# Patient Record
Sex: Male | Born: 1985
Health system: Southern US, Community
[De-identification: ages and names within clinical notes are randomized; demographics above are authoritative.]

## PROBLEM LIST (undated history)

## (undated) DIAGNOSIS — E119 Type 2 diabetes mellitus without complications: Secondary | ICD-10-CM

## (undated) HISTORY — DX: Type 2 diabetes mellitus without complications: E11.9

---

## 2014-01-18 ENCOUNTER — Emergency Department (HOSPITAL_BASED_OUTPATIENT_CLINIC_OR_DEPARTMENT_OTHER)
Admission: EM | Admit: 2014-01-18 | Discharge: 2014-01-18 | Disposition: A | Discharge status: Home / Self Care | Payer: No Typology Code available for payment source | Attending: Emergency Medicine | Admitting: Emergency Medicine

## 2014-01-18 ENCOUNTER — Encounter (HOSPITAL_BASED_OUTPATIENT_CLINIC_OR_DEPARTMENT_OTHER): Payer: Self-pay | Admitting: Emergency Medicine

## 2014-01-18 ENCOUNTER — Emergency Department (HOSPITAL_BASED_OUTPATIENT_CLINIC_OR_DEPARTMENT_OTHER): Payer: No Typology Code available for payment source

## 2014-01-18 DIAGNOSIS — F1210 Cannabis abuse, uncomplicated: Secondary | ICD-10-CM | POA: Insufficient documentation

## 2014-01-18 DIAGNOSIS — H53429 Scotoma of blind spot area, unspecified eye: Secondary | ICD-10-CM | POA: Insufficient documentation

## 2014-01-18 DIAGNOSIS — M542 Cervicalgia: Secondary | ICD-10-CM | POA: Insufficient documentation

## 2014-01-18 DIAGNOSIS — R519 Headache, unspecified: Secondary | ICD-10-CM

## 2014-01-18 DIAGNOSIS — R51 Headache: Secondary | ICD-10-CM | POA: Insufficient documentation

## 2014-01-18 DIAGNOSIS — H9209 Otalgia, unspecified ear: Secondary | ICD-10-CM | POA: Insufficient documentation

## 2014-01-18 DIAGNOSIS — Z87828 Personal history of other (healed) physical injury and trauma: Secondary | ICD-10-CM | POA: Insufficient documentation

## 2014-01-18 DIAGNOSIS — K089 Disorder of teeth and supporting structures, unspecified: Secondary | ICD-10-CM | POA: Insufficient documentation

## 2014-01-18 MED ORDER — OXYCODONE-ACETAMINOPHEN 5-325 MG PO TABS
2.0000 | ORAL_TABLET | Freq: Once | ORAL | Status: AC
Start: 2014-01-18 — End: 2014-01-18
  Administered 2014-01-18: 2 via ORAL
  Filled 2014-01-18: qty 2

## 2014-01-18 NOTE — ED Notes (Addendum)
Patient c/o HA/jaw pain/L side neck pain for the past three days. Has had this issue off and on over the past year. Tool aleve last night & they helped ease the pain. Patient states that he has experienced wax build up in his left ear at times
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
## 2014-01-18 NOTE — Discharge Instructions (Signed)
Migraine Headache A migraine headache is an intense, throbbing pain on one or both sides of your head. A migraine can last for 30 minutes to several hours. CAUSES  The exact cause of a migraine headache is not always known. However, a migraine may be caused when nerves in the brain become irritated and release chemicals that cause inflammation. This causes pain. Certain things may also trigger migraines, such as:  Alcohol.  Smoking.  Stress.  Menstruation.  Aged cheeses.  Foods or drinks that contain nitrates, glutamate, aspartame, or tyramine.  Lack of sleep.  Chocolate.  Caffeine.  Hunger.  Physical exertion.  Fatigue.  Medicines used to treat chest pain (nitroglycerine), birth control pills, estrogen, and some blood pressure medicines. SIGNS AND SYMPTOMS  Pain on one or both sides of your head.  Pulsating or throbbing pain.  Severe pain that prevents daily activities.  Pain that is aggravated by any physical activity.  Nausea, vomiting, or both.  Dizziness.  Pain with exposure to bright lights, loud noises, or activity.  General sensitivity to bright lights, loud noises, or smells. Before you get a migraine, you may get warning signs that a migraine is coming (aura). An aura may include:  Seeing flashing lights.  Seeing bright spots, halos, or zig-zag lines.  Having tunnel vision or blurred vision.  Having feelings of numbness or tingling.  Having trouble talking.  Having muscle weakness. DIAGNOSIS  A migraine headache is often diagnosed based on:  Symptoms.  Physical exam.  A CT scan or MRI of your head. These imaging tests cannot diagnose migraines, but they can help rule out other causes of headaches. TREATMENT Medicines may be given for pain and nausea. Medicines can also be given to help prevent recurrent migraines.  HOME CARE INSTRUCTIONS  Only take over-the-counter or prescription medicines for pain or discomfort as directed by your  health care provider. The use of long-term narcotics is not recommended.  Lie down in a dark, quiet room when you have a migraine.  Keep a journal to find out what may trigger your migraine headaches. For example, write down:  What you eat and drink.  How much sleep you get.  Any change to your diet or medicines.  Limit alcohol consumption.  Quit smoking if you smoke.  Get 7 9 hours of sleep, or as recommended by your health care provider.  Limit stress.  Keep lights dim if bright lights bother you and make your migraines worse. SEEK IMMEDIATE MEDICAL CARE IF:   Your migraine becomes severe.  You have a fever.  You have a stiff neck.  You have vision loss.  You have muscular weakness or loss of muscle control.  You start losing your balance or have trouble walking.  You feel faint or pass out.  You have severe symptoms that are different from your first symptoms. MAKE SURE YOU:   Understand these instructions.  Will watch your condition.  Will get help right away if you are not doing well or get worse. Document Released: 12/09/2005 Document Revised: 09/29/2013 Document Reviewed: 08/16/2013 [employer] Patient Information 2014 [employer].  

## 2017-06-10 ENCOUNTER — Inpatient Hospital Stay (HOSPITAL_BASED_OUTPATIENT_CLINIC_OR_DEPARTMENT_OTHER)
Admission: EM | Admit: 2017-06-10 | Discharge: 2017-06-12 | DRG: 638 | Disposition: A | Discharge status: Home / Self Care | Payer: Medicaid Other | Attending: Internal Medicine | Admitting: Internal Medicine

## 2017-06-10 ENCOUNTER — Encounter (HOSPITAL_BASED_OUTPATIENT_CLINIC_OR_DEPARTMENT_OTHER): Payer: Self-pay | Admitting: *Deleted

## 2017-06-10 ENCOUNTER — Inpatient Hospital Stay (HOSPITAL_COMMUNITY): Payer: Medicaid Other

## 2017-06-10 DIAGNOSIS — E1110 Type 2 diabetes mellitus with ketoacidosis without coma: Secondary | ICD-10-CM | POA: Diagnosis present

## 2017-06-10 DIAGNOSIS — E0810 Diabetes mellitus due to underlying condition with ketoacidosis without coma: Secondary | ICD-10-CM

## 2017-06-10 DIAGNOSIS — E876 Hypokalemia: Secondary | ICD-10-CM | POA: Diagnosis present

## 2017-06-10 DIAGNOSIS — N179 Acute kidney failure, unspecified: Secondary | ICD-10-CM | POA: Diagnosis present

## 2017-06-10 DIAGNOSIS — E860 Dehydration: Secondary | ICD-10-CM | POA: Diagnosis present

## 2017-06-10 DIAGNOSIS — E1010 Type 1 diabetes mellitus with ketoacidosis without coma: Secondary | ICD-10-CM | POA: Diagnosis not present

## 2017-06-10 DIAGNOSIS — E871 Hypo-osmolality and hyponatremia: Secondary | ICD-10-CM | POA: Diagnosis present

## 2017-06-10 LAB — I-STAT VENOUS BLOOD GAS, ED
ACID-BASE DEFICIT: 17 mmol/L — AB (ref 0.0–2.0)
Bicarbonate: 10.9 mmol/L — ABNORMAL LOW (ref 20.0–28.0)
O2 SAT: 35 %
PH VEN: 7.132 — AB (ref 7.250–7.430)
TCO2: 12 mmol/L (ref 0–100)
pCO2, Ven: 32.6 mmHg — ABNORMAL LOW (ref 44.0–60.0)
pO2, Ven: 27 mmHg — CL (ref 32.0–45.0)

## 2017-06-10 LAB — GLUCOSE, CAPILLARY
Glucose-Capillary: 179 mg/dL — ABNORMAL HIGH (ref 65–99)
Glucose-Capillary: 173 mg/dL — ABNORMAL HIGH (ref 65–99)
Glucose-Capillary: 165 mg/dL — ABNORMAL HIGH (ref 65–99)
Glucose-Capillary: 143 mg/dL — ABNORMAL HIGH (ref 65–99)

## 2017-06-10 LAB — CBC WITH DIFFERENTIAL/PLATELET
BASOS ABS: 0.1 10*3/uL (ref 0.0–0.1)
BASOS PCT: 1 %
EOS ABS: 0.1 10*3/uL (ref 0.0–0.7)
Eosinophils Relative: 1 %
HCT: 49 % (ref 39.0–52.0)
HEMOGLOBIN: 17.4 g/dL — AB (ref 13.0–17.0)
Lymphocytes Relative: 39 %
Lymphs Abs: 2.8 10*3/uL (ref 0.7–4.0)
MCH: 30.8 pg (ref 26.0–34.0)
MCHC: 35.5 g/dL (ref 30.0–36.0)
MCV: 86.7 fL (ref 78.0–100.0)
MONOS PCT: 8 %
Monocytes Absolute: 0.6 10*3/uL (ref 0.1–1.0)
NEUTROS ABS: 3.7 10*3/uL (ref 1.7–7.7)
Neutrophils Relative %: 51 %
Platelets: 238 10*3/uL (ref 150–400)
RBC: 5.65 MIL/uL (ref 4.22–5.81)
RDW: 13.4 % (ref 11.5–15.5)
WBC: 7.1 10*3/uL (ref 4.0–10.5)

## 2017-06-10 LAB — CBC
HCT: 40 % (ref 39.0–52.0)
Hemoglobin: 14.1 g/dL (ref 13.0–17.0)
MCH: 30.3 pg (ref 26.0–34.0)
MCHC: 35.3 g/dL (ref 30.0–36.0)
MCV: 86 fL (ref 78.0–100.0)
Platelets: 219 10*3/uL (ref 150–400)
RBC: 4.65 MIL/uL (ref 4.22–5.81)
RDW: 13.2 % (ref 11.5–15.5)
WBC: 7.2 10*3/uL (ref 4.0–10.5)

## 2017-06-10 LAB — BASIC METABOLIC PANEL
ANION GAP: 19 — AB (ref 5–15)
Anion gap: 8 (ref 5–15)
BUN: 13 mg/dL (ref 6–20)
BUN: 9 mg/dL (ref 6–20)
CO2: 12 mmol/L — ABNORMAL LOW (ref 22–32)
CO2: 18 mmol/L — ABNORMAL LOW (ref 22–32)
CREATININE: 1.25 mg/dL — AB (ref 0.61–1.24)
CREATININE: 0.99 mg/dL (ref 0.61–1.24)
Calcium: 9.9 mg/dL (ref 8.9–10.3)
Calcium: 8.7 mg/dL — ABNORMAL LOW (ref 8.9–10.3)
Chloride: 101 mmol/L (ref 101–111)
Chloride: 107 mmol/L (ref 101–111)
GFR calc Af Amer: >60 mL/min (ref >60)
GFR calc non Af Amer: >60 mL/min (ref >60)
GLUCOSE: 157 mg/dL — AB (ref 65–99)
Glucose, Bld: 347 mg/dL — ABNORMAL HIGH (ref 65–99)
Potassium: 4.1 mmol/L (ref 3.5–5.1)
Potassium: 3 mmol/L — ABNORMAL LOW (ref 3.5–5.1)
SODIUM: 132 mmol/L — AB (ref 135–145)
Sodium: 133 mmol/L — ABNORMAL LOW (ref 135–145)

## 2017-06-10 LAB — URINALYSIS, MICROSCOPIC (REFLEX)

## 2017-06-10 LAB — URINALYSIS, ROUTINE W REFLEX MICROSCOPIC
BILIRUBIN URINE: NEGATIVE
Glucose, UA: >=500 mg/dL — AB
Ketones, ur: >80 mg/dL — AB
LEUKOCYTES UA: NEGATIVE
NITRITE: NEGATIVE
Protein, ur: 30 mg/dL — AB
Specific Gravity, Urine: 1.025 (ref 1.005–1.030)
pH: 5 (ref 5.0–8.0)

## 2017-06-10 LAB — MAGNESIUM: MAGNESIUM: 1.9 mg/dL (ref 1.7–2.4)

## 2017-06-10 LAB — CBG MONITORING, ED
GLUCOSE-CAPILLARY: 245 mg/dL — AB (ref 65–99)
GLUCOSE-CAPILLARY: 232 mg/dL — AB (ref 65–99)
Glucose-Capillary: 273 mg/dL — ABNORMAL HIGH (ref 65–99)

## 2017-06-10 LAB — PHOSPHORUS: Phosphorus: 1 mg/dL — CL (ref 2.5–4.6)

## 2017-06-10 LAB — MRSA PCR SCREENING: MRSA by PCR: NEGATIVE

## 2017-06-10 MED ORDER — SODIUM CHLORIDE 0.9 % IV SOLN: INTRAVENOUS | Status: DC

## 2017-06-10 MED ORDER — HEPARIN SODIUM (PORCINE) 5000 UNIT/ML IJ SOLN
5000.0000 [IU] | Freq: Three times a day (TID) | INTRAMUSCULAR | Status: DC
  Administered 2017-06-10 – 2017-06-12 (×6): 5000 [IU] via SUBCUTANEOUS
  Filled 2017-06-10 (×5): qty 1

## 2017-06-10 MED ORDER — BISMUTH SUBSALICYLATE 262 MG/15ML PO SUSP
30.0000 mL | Freq: Four times a day (QID) | ORAL | Status: DC | PRN
  Filled 2017-06-10: qty 236

## 2017-06-10 MED ORDER — SODIUM CHLORIDE 0.9 % IV SOLN
INTRAVENOUS | Status: AC
  Administered 2017-06-10: 22:00:00 via INTRAVENOUS

## 2017-06-10 MED ORDER — DEXTROSE-NACL 5-0.45 % IV SOLN
INTRAVENOUS | Status: DC
  Administered 2017-06-10: 16:00:00 via INTRAVENOUS

## 2017-06-10 MED ORDER — POTASSIUM CHLORIDE 10 MEQ/100ML IV SOLN
10.0000 meq | INTRAVENOUS | Status: AC
  Administered 2017-06-10 (×2): 10 meq via INTRAVENOUS
  Filled 2017-06-10 (×2): qty 100

## 2017-06-10 MED ORDER — SODIUM CHLORIDE 0.9 % IV SOLN
INTRAVENOUS | Status: DC
  Administered 2017-06-10: 22:00:00 via INTRAVENOUS
  Filled 2017-06-10 (×2): qty 1

## 2017-06-10 MED ORDER — SODIUM CHLORIDE 0.9 % IV SOLN
INTRAVENOUS | Status: DC
  Administered 2017-06-10: 2.1 [IU]/h via INTRAVENOUS
  Filled 2017-06-10 (×2): qty 1

## 2017-06-10 MED ORDER — POTASSIUM PHOSPHATES 15 MMOLE/5ML IV SOLN
40.0000 meq | Freq: Once | INTRAVENOUS | Status: AC
  Administered 2017-06-10: 40 meq via INTRAVENOUS
  Filled 2017-06-10: qty 9.09

## 2017-06-10 MED ORDER — DEXTROSE-NACL 5-0.45 % IV SOLN
INTRAVENOUS | Status: DC
  Administered 2017-06-10 – 2017-06-11 (×2): via INTRAVENOUS

## 2017-06-10 MED ORDER — LACTATED RINGERS IV BOLUS (SEPSIS)
2000.0000 mL | Freq: Once | INTRAVENOUS | Status: AC
  Administered 2017-06-10: 2000 mL via INTRAVENOUS

## 2017-06-10 NOTE — H&P (Signed)
History and Physical    Adrian Vasquez ZOX:[id number] DOB: 10-Oct-1986 DOA: 06/10/2017  Referring MD/NP/PA:  PCP: Patient, No Pcp Per  Outpatient Specialists:  Patient coming from: Home  Chief Complaint: Fatigue  HPI: Adrian Vasquez is a 31 y.o. male with medical history no history of diabetes mellitus presented with three-day history of fatigue is assisted with polydipsia and polyuria without any fever or chills. He had had some nausea with an episode of vomiting, no abdominal pain.  She he was seen at the doctor's office in [facility] whereupon he was noted to have hyperglycemia of more than 300 mg/dL with ketonuria and referred to the emergency room at [facility].  Nasal chest pain, shortness of breath, diarrhea, palpitations.  ED Course: At the ED patient was hemodynamically stable without any fever. Labs revealed hyperglycemia of 347 mg/dL with elevated anion gap/acidosis, creatinine of 1.25 and potassium 4.1. Urinalysis was negative for any occult infection and patient was then IV fluids with insulin.  Review of Systems: As per HPI otherwise 10 point review of systems negative.    History reviewed. No pertinent past medical history.  History reviewed. No pertinent surgical history.   reports that he has never smoked. He has never used smokeless tobacco. He reports that he does not drink alcohol or use drugs.  No Known Allergies  History reviewed. No pertinent family history.   Prior to Admission medications   Medication Sig Start Date End Date Taking? Authorizing Provider  bismuth subsalicylate (PEPTO BISMOL) 262 MG/15ML suspension Take 30 mLs by mouth every 6 (six) hours as needed for indigestion.   Yes [provider]  calcium carbonate (TUMS - DOSED IN MG ELEMENTAL CALCIUM) 500 MG chewable tablet Chew 1 tablet by mouth 2 (two) times daily as needed for indigestion or heartburn.   Yes [provider]    Physical Exam: Vitals:   06/10/17 1730 06/10/17 1800 06/10/17 1830 06/10/17 2000  BP: (!) 138/91 135/89  134/83  Pulse: 73 80 79 84  Resp: 16 13 17 12   Temp:      TempSrc:      SpO2: 100% 100% 100% 99%  Weight:      Height:          Constitutional: NAD, calm, comfortable Vitals:   06/10/17 1730 06/10/17 1800 06/10/17 1830 06/10/17 2000  BP: (!) 138/91 135/89  134/83  Pulse: 73 80 79 84  Resp: 16 13 17 12   Temp:      TempSrc:      SpO2: 100% 100% 100% 99%  Weight:      Height:       Eyes: PERRL, lids and conjunctivae normal ENMT: Mucous membranes are moist. Posterior pharynx clear of any exudate or lesions.Normal dentition.  Neck: normal, supple, no masses, no thyromegaly Respiratory: clear to auscultation bilaterally, no wheezing, no crackles. Normal respiratory effort. No accessory muscle use.  Cardiovascular: Regular rate and rhythm, no murmurs / rubs / gallops. No extremity edema. 2+ pedal pulses. No carotid bruits.  Abdomen: no tenderness, no masses palpated. No hepatosplenomegaly. Bowel sounds positive.  Musculoskeletal: no clubbing / cyanosis. No joint deformity upper and lower extremities. Good ROM, no contractures. Normal muscle tone.  Skin: no rashes, lesions, ulcers. No induration Neurologic: CN 2-12 grossly intact. Sensation intact, DTR normal. Strength 5/5 in all 4.  Psychiatric: Normal judgment and insight. Alert and oriented x 3. Normal mood.     Labs on Admission: I have personally reviewed following labs and  imaging studies  CBC:  Recent Labs Lab 06/10/17 1412  WBC 7.1  NEUTROABS 3.7  HGB 17.4*  HCT 49.0  MCV 86.7  PLT 238   Basic Metabolic Panel:  Recent Labs Lab 06/10/17 1412  NA 132*  K 4.1  CL 101  CO2 12*  GLUCOSE 347*  BUN 13  CREATININE 1.25*  CALCIUM 9.9   GFR: Estimated Creatinine Clearance: 85.4 mL/min (A) (by C-G formula based on SCr of 1.25 mg/dL (H)). Liver Function Tests: No results for input(s): AST, ALT, ALKPHOS, BILITOT, PROT, ALBUMIN in  the last 168 hours. No results for input(s): LIPASE, AMYLASE in the last 168 hours. No results for input(s): AMMONIA in the last 168 hours. Coagulation Profile: No results for input(s): INR, PROTIME in the last 168 hours. Cardiac Enzymes: No results for input(s): CKTOTAL, CKMB, CKMBINDEX, TROPONINI in the last 168 hours. BNP (last 3 results) No results for input(s): PROBNP in the last 8760 hours. HbA1C: No results for input(s): HGBA1C in the last 72 hours. CBG:  Recent Labs Lab 06/10/17 1523 06/10/17 1645 06/10/17 1749 06/10/17 1948 06/10/17 2051  GLUCAP 273* 245* 232* 179* 173*   Lipid Profile: No results for input(s): CHOL, HDL, LDLCALC, TRIG, CHOLHDL, LDLDIRECT in the last 72 hours. Thyroid Function Tests: No results for input(s): TSH, T4TOTAL, FREET4, T3FREE, THYROIDAB in the last 72 hours. Anemia Panel: No results for input(s): VITAMINB12, FOLATE, FERRITIN, TIBC, IRON, RETICCTPCT in the last 72 hours. Urine analysis:    Component Value Date/Time   COLORURINE YELLOW 06/10/2017 1602   APPEARANCEUR CLEAR 06/10/2017 1602   LABSPEC 1.025 06/10/2017 1602   PHURINE 5.0 06/10/2017 1602   GLUCOSEU >=500 (A) 06/10/2017 1602   HGBUR TRACE (A) 06/10/2017 1602   BILIRUBINUR NEGATIVE 06/10/2017 1602   KETONESUR >80 (A) 06/10/2017 1602   PROTEINUR 30 (A) 06/10/2017 1602   NITRITE NEGATIVE 06/10/2017 1602   LEUKOCYTESUR NEGATIVE 06/10/2017 1602   Sepsis Labs: @LABRCNTIP (procalcitonin:4,lacticidven:4) )No results found for this or any previous visit (from the past 240 hour(s)).   Radiological Exams on Admission: No results found.  EKG: n/a  Assessment/Plan Principal Problem:   DKA (diabetic ketoacidoses) (HCC)    #1 DKA: No prior history of diabetes mellitus No obvious focus of infection IVF- adjust as needed IV insulin -transition when and on Closes Monitor renal function and electrolytes/potassium  #2 Renal Insufficiency: Related to diagnosis #1 Should improve  with IV fluids   #3 Newly Diagnosed DM: Check A1c DM education/medication when DKA resolved   DVT prophylaxis:Heparin Code Status: (Full) Family Communication: Mother at bedside  Disposition Plan: Home Consults called:  Admission status:(inpatient / SDU)   OSEI-BONSU,Jaylyn Iyer MD [facility] Pager [phone]  If 7PM-7AM, please contact night-coverage [email or web address] Password [email or web address]  06/10/2017, 9:10 PM

## 2017-06-10 NOTE — ED Provider Notes (Signed)
[facility] Provider Note   CSN: [id number] Arrival date & time: 06/10/17  1347     History   Chief Complaint Chief Complaint  Patient presents with  . Fatigue    HPI Adrian Vasquez is a 31 y.o. male.  31 yo F with a chief complaint of fatigue and vomiting. The patient states that he is feeling bad for the past week or so. Having polyphagia and polydipsia. A couple episodes of vomiting yesterday. Denies abdominal pain. Denies shortness of breath. No history of hyperglycemia. Was seen at urgent care and there is concern for diabetic ketoacidosis and was sent here. He denies fevers chest pain dysuria flank pain.   The history is provided by the patient.  Illness  This is a new problem. The current episode started yesterday. The problem occurs constantly. The problem has not changed since onset.Pertinent negatives include no chest pain, no abdominal pain, no headaches and no shortness of breath. Nothing aggravates the symptoms. Nothing relieves the symptoms. He has tried nothing for the symptoms. The treatment provided no relief.    Past Medical History:  Diagnosis Date  . Diabetes mellitus without complication (HCC)     There are no active problems to display for this patient.   History reviewed. No pertinent surgical history.     Home Medications    Prior to Admission medications   Not on File    Family History No family history on file.  Social History Social History  Substance Use Topics  . Smoking status: Never Smoker  . Smokeless tobacco: Never Used  . Alcohol use No     Allergies   Patient has no known allergies.   Review of Systems Review of Systems  Constitutional: Negative for chills and fever.  HENT: Negative for congestion and facial swelling.   Eyes: Negative for discharge and visual disturbance.  Respiratory: Negative for shortness of breath.   Cardiovascular: Negative for chest pain and palpitations.  Gastrointestinal:  Negative for abdominal pain, diarrhea and vomiting.  Musculoskeletal: Negative for arthralgias and myalgias.  Skin: Negative for color change and rash.  Neurological: Negative for tremors, syncope and headaches.  Psychiatric/Behavioral: Negative for confusion and dysphoric mood.     Physical Exam Updated Vital Signs BP (!) 147/96   Pulse 79   Temp 98.1 F (36.7 C) (Oral)   Resp 17   Ht 6\' 4"  (1.93 m)   Wt 71.2 kg (157 lb)   SpO2 100%   BMI 19.11 kg/m   Physical Exam  Constitutional: He is oriented to person, place, and time. He appears well-developed and well-nourished.  HENT:  Head: Normocephalic and atraumatic.  Eyes: EOM are normal. Pupils are equal, round, and reactive to light.  Neck: Normal range of motion. Neck supple. No JVD present.  Cardiovascular: Normal rate and regular rhythm.  Exam reveals no gallop and no friction rub.   No murmur heard. Pulmonary/Chest: No respiratory distress. He has no wheezes.  Abdominal: He exhibits no distension and no mass. There is no tenderness. There is no rebound and no guarding.  Musculoskeletal: Normal range of motion.  Neurological: He is alert and oriented to person, place, and time.  Skin: No rash noted. No pallor.  Psychiatric: He has a normal mood and affect. His behavior is normal.  Nursing note and vitals reviewed.    ED Treatments / Results  Labs (all labs ordered are listed, but only abnormal results are displayed) Labs Reviewed  CBC WITH DIFFERENTIAL/PLATELET - Abnormal; Notable  for the following:       Result Value   Hemoglobin 17.4 (*)    All other components within normal limits  BASIC METABOLIC PANEL - Abnormal; Notable for the following:    Sodium 132 (*)    CO2 12 (*)    Glucose, Bld 347 (*)    Creatinine, Ser 1.25 (*)    Anion gap 19 (*)    All other components within normal limits  I-STAT VENOUS BLOOD GAS, ED - Abnormal; Notable for the following:    pH, Ven 7.132 (*)    pCO2, Ven 32.6 (*)    pO2,  Ven 27.0 (*)    Bicarbonate 10.9 (*)    Acid-base deficit 17.0 (*)    All other components within normal limits  BLOOD GAS, VENOUS  URINALYSIS, ROUTINE W REFLEX MICROSCOPIC    EKG  EKG Interpretation None       Radiology No results found.  Procedures Procedures (including critical care time)  Medications Ordered in ED Medications  lactated ringers bolus 2,000 mL (2,000 mLs Intravenous New Bag/Given 06/10/17 1439)  dextrose 5 %-0.45 % sodium chloride infusion (not administered)  insulin regular (NOVOLIN R,HUMULIN R) 100 Units in sodium chloride 0.9 % 100 mL (1 Units/mL) infusion (not administered)     Initial Impression / Assessment and Plan / ED Course  I have reviewed the triage vital signs and the nursing notes.  Pertinent labs & imaging results that were available during my care of the patient were reviewed by me and considered in my medical decision making (see chart for details).     31 yo M with Diabetic ketoacidosis. He has never previously diagnosed with diabetes. Found to have an anion gap as well as a acidosis of 7.1. Will start on an insulin drip. Was given 2 L of LR on arrival.  CRITICAL CARE Performed by: Rae Roamaniel Patrick Azaryah Heathcock   Total critical care time: 35 minutes  Critical care time was exclusive of separately billable procedures and treating other patients.  Critical care was necessary to treat or prevent imminent or life-threatening deterioration.  Critical care was time spent personally by me on the following activities: development of treatment plan with patient and/or surrogate as well as nursing, discussions with consultants, evaluation of patient's response to treatment, examination of patient, obtaining history from patient or surrogate, ordering and performing treatments and interventions, ordering and review of laboratory studies, ordering and review of radiographic studies, pulse oximetry and re-evaluation of patient's condition.  The patients  results and plan were reviewed and discussed.   Any x-rays performed were independently reviewed by myself.   Differential diagnosis were considered with the presenting HPI.  Medications  lactated ringers bolus 2,000 mL (2,000 mLs Intravenous New Bag/Given 06/10/17 1439)  dextrose 5 %-0.45 % sodium chloride infusion (not administered)  insulin regular (NOVOLIN R,HUMULIN R) 100 Units in sodium chloride 0.9 % 100 mL (1 Units/mL) infusion (not administered)    Vitals:   06/10/17 1404 06/10/17 1408 06/10/17 1430 06/10/17 1500  BP:  (!) 143/96 (!) 138/95 (!) 147/96  Pulse:  90 81 79  Resp:  20  17  Temp:  98.1 F (36.7 C)    TempSrc:  Oral    SpO2:  98% 100% 100%  Weight: 71.2 kg (157 lb)     Height: 6\' 4"  (1.93 m)       Final diagnoses:  Diabetic ketoacidosis without coma associated with type 1 diabetes mellitus (HCC)    Admission/ observation were  discussed with the admitting physician, patient and/or family and they are comfortable with the plan.   Final Clinical Impressions(s) / ED Diagnoses   Final diagnoses:  Diabetic ketoacidosis without coma associated with type 1 diabetes mellitus [facility])    New Prescriptions New Prescriptions   No medications on file     Melene Plan, DO 06/10/17 1532

## 2017-06-10 NOTE — ED Triage Notes (Addendum)
Fatigue x 3 days, thirsty, no appetite, weight loss. [facility] and he had ketones in his urine, CBG 360. He was told to come here for further testing.

## 2017-06-10 NOTE — ED Notes (Signed)
Patient placed on cardiac monitor with vitals set to Q 30 min.  

## 2017-06-10 NOTE — Progress Notes (Signed)
CRITICAL VALUE ALERT  Critical Value: Phosphorus 1.0  Date & Time Notied:  06/10/2017 @ 2305  Provider Notified: Donnamarie PoagK. Kirby  Orders Received/Actions taken: 40meq potassium phosphate ordered and given

## 2017-06-11 DIAGNOSIS — E871 Hypo-osmolality and hyponatremia: Secondary | ICD-10-CM

## 2017-06-11 DIAGNOSIS — E118 Type 2 diabetes mellitus with unspecified complications: Secondary | ICD-10-CM

## 2017-06-11 LAB — BASIC METABOLIC PANEL
ANION GAP: 8 (ref 5–15)
ANION GAP: 8 (ref 5–15)
ANION GAP: 9 (ref 5–15)
Anion gap: 9 (ref 5–15)
Anion gap: 7 (ref 5–15)
BUN: <5 mg/dL — ABNORMAL LOW (ref 6–20)
BUN: 5 mg/dL — AB (ref 6–20)
BUN: 6 mg/dL (ref 6–20)
BUN: 8 mg/dL (ref 6–20)
CALCIUM: 8.8 mg/dL — AB (ref 8.9–10.3)
CHLORIDE: 107 mmol/L (ref 101–111)
CHLORIDE: 108 mmol/L (ref 101–111)
CHLORIDE: 108 mmol/L (ref 101–111)
CO2: 17 mmol/L — ABNORMAL LOW (ref 22–32)
CO2: 18 mmol/L — ABNORMAL LOW (ref 22–32)
CO2: 20 mmol/L — AB (ref 22–32)
CO2: 21 mmol/L — AB (ref 22–32)
CO2: 21 mmol/L — ABNORMAL LOW (ref 22–32)
CREATININE: 0.76 mg/dL (ref 0.61–1.24)
Calcium: 8.5 mg/dL — ABNORMAL LOW (ref 8.9–10.3)
Calcium: 8.7 mg/dL — ABNORMAL LOW (ref 8.9–10.3)
Calcium: 8.6 mg/dL — ABNORMAL LOW (ref 8.9–10.3)
Calcium: 9.1 mg/dL (ref 8.9–10.3)
Chloride: 105 mmol/L (ref 101–111)
Chloride: 104 mmol/L (ref 101–111)
Creatinine, Ser: 0.85 mg/dL (ref 0.61–1.24)
Creatinine, Ser: 0.87 mg/dL (ref 0.61–1.24)
Creatinine, Ser: 0.8 mg/dL (ref 0.61–1.24)
Creatinine, Ser: 0.74 mg/dL (ref 0.61–1.24)
GFR calc Af Amer: >60 mL/min (ref >60)
GFR calc Af Amer: >60 mL/min (ref >60)
GFR calc non Af Amer: >60 mL/min (ref >60)
GFR calc non Af Amer: >60 mL/min (ref >60)
GLUCOSE: 145 mg/dL — AB (ref 65–99)
Glucose, Bld: 159 mg/dL — ABNORMAL HIGH (ref 65–99)
Glucose, Bld: 189 mg/dL — ABNORMAL HIGH (ref 65–99)
Glucose, Bld: 155 mg/dL — ABNORMAL HIGH (ref 65–99)
Glucose, Bld: 213 mg/dL — ABNORMAL HIGH (ref 65–99)
POTASSIUM: 2.8 mmol/L — AB (ref 3.5–5.1)
POTASSIUM: 2.9 mmol/L — AB (ref 3.5–5.1)
POTASSIUM: 3.1 mmol/L — AB (ref 3.5–5.1)
POTASSIUM: 3.2 mmol/L — AB (ref 3.5–5.1)
POTASSIUM: 3.2 mmol/L — AB (ref 3.5–5.1)
SODIUM: 133 mmol/L — AB (ref 135–145)
SODIUM: 134 mmol/L — AB (ref 135–145)
SODIUM: 136 mmol/L (ref 135–145)
Sodium: 134 mmol/L — ABNORMAL LOW (ref 135–145)
Sodium: 133 mmol/L — ABNORMAL LOW (ref 135–145)

## 2017-06-11 LAB — GLUCOSE, CAPILLARY
GLUCOSE-CAPILLARY: 188 mg/dL — AB (ref 65–99)
GLUCOSE-CAPILLARY: 153 mg/dL — AB (ref 65–99)
GLUCOSE-CAPILLARY: 156 mg/dL — AB (ref 65–99)
GLUCOSE-CAPILLARY: 116 mg/dL — AB (ref 65–99)
GLUCOSE-CAPILLARY: 172 mg/dL — AB (ref 65–99)
GLUCOSE-CAPILLARY: 207 mg/dL — AB (ref 65–99)
GLUCOSE-CAPILLARY: 142 mg/dL — AB (ref 65–99)
Glucose-Capillary: 128 mg/dL — ABNORMAL HIGH (ref 65–99)
Glucose-Capillary: 141 mg/dL — ABNORMAL HIGH (ref 65–99)
Glucose-Capillary: 144 mg/dL — ABNORMAL HIGH (ref 65–99)
Glucose-Capillary: 159 mg/dL — ABNORMAL HIGH (ref 65–99)
Glucose-Capillary: 161 mg/dL — ABNORMAL HIGH (ref 65–99)
Glucose-Capillary: 172 mg/dL — ABNORMAL HIGH (ref 65–99)
Glucose-Capillary: 173 mg/dL — ABNORMAL HIGH (ref 65–99)
Glucose-Capillary: 174 mg/dL — ABNORMAL HIGH (ref 65–99)
Glucose-Capillary: 177 mg/dL — ABNORMAL HIGH (ref 65–99)
Glucose-Capillary: 180 mg/dL — ABNORMAL HIGH (ref 65–99)
Glucose-Capillary: 186 mg/dL — ABNORMAL HIGH (ref 65–99)

## 2017-06-11 LAB — PHOSPHORUS: Phosphorus: 3 mg/dL (ref 2.5–4.6)

## 2017-06-11 LAB — HIV ANTIBODY (ROUTINE TESTING W REFLEX): HIV Screen 4th Generation wRfx: NONREACTIVE

## 2017-06-11 MED ORDER — POTASSIUM CHLORIDE 10 MEQ/100ML IV SOLN
10.0000 meq | INTRAVENOUS | Status: AC
  Administered 2017-06-11 (×6): 10 meq via INTRAVENOUS
  Filled 2017-06-11 (×5): qty 100

## 2017-06-11 MED ORDER — INSULIN GLARGINE 100 UNIT/ML ~~LOC~~ SOLN
10.0000 [IU] | Freq: Once | SUBCUTANEOUS | Status: AC
  Administered 2017-06-11: 10 [IU] via SUBCUTANEOUS
  Filled 2017-06-11: qty 0.1

## 2017-06-11 MED ORDER — INSULIN STARTER KIT- SYRINGES (ENGLISH)
1.0000 | Freq: Once | Status: AC
  Administered 2017-06-11: 1
  Filled 2017-06-11: qty 1

## 2017-06-11 MED ORDER — PNEUMOCOCCAL VAC POLYVALENT 25 MCG/0.5ML IJ INJ
0.5000 mL | INJECTION | INTRAMUSCULAR | Status: DC
  Filled 2017-06-11: qty 0.5

## 2017-06-11 MED ORDER — LIVING WELL WITH DIABETES BOOK
Freq: Once | Status: AC
  Administered 2017-06-11: 14:00:00
  Filled 2017-06-11: qty 1

## 2017-06-11 MED ORDER — INSULIN ASPART 100 UNIT/ML ~~LOC~~ SOLN
0.0000 [IU] | Freq: Three times a day (TID) | SUBCUTANEOUS | Status: DC
  Administered 2017-06-11: 2 [IU] via SUBCUTANEOUS
  Administered 2017-06-12: 3 [IU] via SUBCUTANEOUS
  Administered 2017-06-12: 5 [IU] via SUBCUTANEOUS

## 2017-06-11 MED ORDER — POTASSIUM CHLORIDE CRYS ER 20 MEQ PO TBCR
40.0000 meq | EXTENDED_RELEASE_TABLET | Freq: Once | ORAL | Status: AC
  Administered 2017-06-11: 40 meq via ORAL
  Filled 2017-06-11: qty 2

## 2017-06-11 MED ORDER — LACTATED RINGERS IV BOLUS (SEPSIS)
1000.0000 mL | Freq: Once | INTRAVENOUS | Status: AC
  Administered 2017-06-11: 1000 mL via INTRAVENOUS

## 2017-06-11 NOTE — Progress Notes (Signed)
Inpatient Diabetes Program Recommendations  AACE/ADA: New Consensus Statement on Inpatient Glycemic Control (2015)  Target Ranges:  Prepandial:   less than 140 mg/dL      Peak postprandial:   less than 180 mg/dL (1-2 hours)      Critically ill patients:  140 - 180 mg/dL   Lab Results  Component Value Date   GLUCAP 186 (H) 06/11/2017    Review of Glycemic Control  Diabetes history: None Outpatient Diabetes medications: N/A Current orders for Inpatient glycemic control: IV insulin per DKA orders   Spoke with patient about new diabetes diagnosis. Discussed differences between Type 1 vs Type 2. Discussed A1C results () and explained what an A1C is and informed patient that his current A1C indicates an average glucose of 240 mg/dl over the past 2-3 months. Discussed basic pathophysiology of DM, basic home care, importance of checking CBGs and maintaining good CBG control to prevent long-term and short-term complications. Discussed impact of nutrition, exercise, stress, sickness, and medications on diabetes control. Reviewed Living Well with diabetes booklet and encouraged patient to read through entire book.  Informed patient that RN will be asking him to self-administer insulin to ensure proper technique and ability to administer self insulin shots.   Patient verbalized understanding of information discussed and he states that he has no further questions at this time related to diabetes.   RNs to provide ongoing basic DM education at bedside with this patient and engage patient to actively check blood glucose and administer insulin injections.   When ready for transition, give Lantus 2 hours prior to discontinuation of drip.  Recommendations: Lantus 20 units Q24H Novolog 0-9 units tidwc and hs Will need meal coverage insulin when eating regular meal.  Will continue to follow. Thank you. Ailene Ardshonda Jaelynn Currier, RD, LDN, CDE Inpatient Diabetes Coordinator [phone]

## 2017-06-11 NOTE — Progress Notes (Signed)
PROGRESS NOTE    Adrian Vasquez  GNF:[id number] DOB: Jun 02, 1986 DOA: 06/10/2017 PCP: Patient, No Pcp Per   Chief Complaint  Patient presents with  . Fatigue    Brief Narrative:  HPI On 06/10/2017 by Dr. Greggory Stallion Vasquez Adrian Vasquez is a 31 y.o. male with medical history no history of diabetes mellitus presented with three-day history of fatigue is assisted with polydipsia and polyuria without any fever or chills. He had had some nausea with an episode of vomiting, no abdominal pain.  She he was seen at the doctor's office in [place] [facility] whereupon he was noted to have hyperglycemia of more than 300 mg/dL with ketonuria and referred to the emergency room at [facility]. Nasal chest pain, shortness of breath, diarrhea, palpitations. Assessment & Plan   Diabetic ketoacidosis/diabetes mellitus, type II -no history of diabetes mellitus -No obvious source of infection, chest x-ray and UA unremarkable for infection -Patient placed on glucose stabilizer -Patient presented with elevated gap of 19, blood sugar 347 -Gap has now closed over patient continues to have metabolic acidosis with CO2 of 18 -Continue insulin drip until CO2 level above 20 -Will then transition to Lantus and insulin sliding scale -Will place patient on clear liquid diet and advance as tolerated -Diabetes coordinator consulted appreciated -Case management consulted appreciated as patient is unsure with no primary care physician -Hemoglobin A1c pending  Hypokalemia -Likely secondary to insulin drip -Continue to replace and monitor BMP -Magnesium level 1.9  Hyponatremia -Likely secondary to hyperglycemia -sodium levels now normalized -Continue to monitor BMP  Fatigue -Likely secondary to the above  Acute kidney injury -Likely secondary to dehydration and DKA -Creatinine 1.25 upon admission, appears to be improving, creatinine currently 0.76 -Continue to monitor BMP  DVT Prophylaxis   heparin  Code Status: Full  Family Communication: Family at bedside  Disposition Plan: Continue to monitor and stepdown given need for glucose stabilizer. Home when stable  Consultants None  Procedures  None  Antibiotics   Anti-infectives    None      Subjective:   Adrian Vasquez seen and examined today.  Patient denies any chest pain, short of breath, abdominal pain, nausea or vomiting, diarrhea or constipation. Does feel hungry. Denies any recent infection.   Objective:   Vitals:   06/11/17 0425 06/11/17 0600 06/11/17 0800 06/11/17 1000  BP:  131/90 129/87 123/73  Pulse:  69 86 70  Resp:  10 10 15   Temp: 98.4 F (36.9 C)     TempSrc: Oral     SpO2:  100% 100% 99%  Weight:      Height:        Intake/Output Summary (Last 24 hours) at 06/11/17 1329 Last data filed at 06/11/17 1121  Gross per 24 hour  Intake          8074.36 ml  Output             2725 ml  Net          5349.36 ml   Filed Weights   06/10/17 1404  Weight: 71.2 kg (157 lb)    Exam  General: Well developed, well nourished, NAD, appears stated age  HEENT: NCAT,  mucous membranes moist.   Cardiovascular: S1 S2 auscultated, no rubs, murmurs or gallops. Regular rate and rhythm.  Respiratory: Clear to auscultation bilaterally with equal chest rise  Abdomen: Soft, nontender, nondistended, + bowel sounds  Extremities: warm dry without cyanosis clubbing or edema  Neuro: AAOx3, nonfocal  Psych: Normal  affect and demeanor with intact judgement and insight   Data Reviewed: I have personally reviewed following labs and imaging studies  CBC:  Recent Labs Lab 06/10/17 1412 06/10/17 2156  WBC 7.1 7.2  NEUTROABS 3.7  --   HGB 17.4* 14.1  HCT 49.0 40.0  MCV 86.7 86.0  PLT 238 219   Basic Metabolic Panel:  Recent Labs Lab 06/10/17 1412 06/10/17 2156 06/11/17 0055 06/11/17 0544 06/11/17 1012  NA 132* 133* 133* 134* 136  K 4.1 3.0* 3.2* 2.9* 3.1*  CL 101 107 108 107 108  CO2  12* 18* 17* 18* 21*  GLUCOSE 347* 157* 159* 189* 155*  BUN 13 9 8 6  5*  CREATININE 1.25* 0.99 0.85 0.87 0.76  CALCIUM 9.9 8.7* 8.5* 8.7* 8.6*  MG  --  1.9  --   --   --   PHOS  --  1.0*  --  3.0  --    GFR: Estimated Creatinine Clearance: 133.5 mL/min (by C-G formula based on SCr of 0.76 mg/dL). Liver Function Tests: No results for input(s): AST, ALT, ALKPHOS, BILITOT, PROT, ALBUMIN in the last 168 hours. No results for input(s): LIPASE, AMYLASE in the last 168 hours. No results for input(s): AMMONIA in the last 168 hours. Coagulation Profile: No results for input(s): INR, PROTIME in the last 168 hours. Cardiac Enzymes: No results for input(s): CKTOTAL, CKMB, CKMBINDEX, TROPONINI in the last 168 hours. BNP (last 3 results) No results for input(s): PROBNP in the last 8760 hours. HbA1C: No results for input(s): HGBA1C in the last 72 hours. CBG:  Recent Labs Lab 06/11/17 0818 06/11/17 0926 06/11/17 1030 06/11/17 1138 06/11/17 1247  GLUCAP 116* 128* 172* 173* 186*   Lipid Profile: No results for input(s): CHOL, HDL, LDLCALC, TRIG, CHOLHDL, LDLDIRECT in the last 72 hours. Thyroid Function Tests: No results for input(s): TSH, T4TOTAL, FREET4, T3FREE, THYROIDAB in the last 72 hours. Anemia Panel: No results for input(s): VITAMINB12, FOLATE, FERRITIN, TIBC, IRON, RETICCTPCT in the last 72 hours. Urine analysis:    Component Value Date/Time   COLORURINE YELLOW 06/10/2017 1602   APPEARANCEUR CLEAR 06/10/2017 1602   LABSPEC 1.025 06/10/2017 1602   PHURINE 5.0 06/10/2017 1602   GLUCOSEU >=500 (A) 06/10/2017 1602   HGBUR TRACE (A) 06/10/2017 1602   BILIRUBINUR NEGATIVE 06/10/2017 1602   KETONESUR >80 (A) 06/10/2017 1602   PROTEINUR 30 (A) 06/10/2017 1602   NITRITE NEGATIVE 06/10/2017 1602   LEUKOCYTESUR NEGATIVE 06/10/2017 1602   Sepsis Labs: @LABRCNTIP (procalcitonin:4,lacticidven:4)  ) Recent Results (from the past 240 hour(s))  MRSA PCR Screening     Status: None    Collection Time: 06/10/17  8:02 PM  Result Value Ref Range Status   MRSA by PCR NEGATIVE NEGATIVE Final    Comment:        The GeneXpert MRSA Assay (FDA approved for NASAL specimens only), is one component of a comprehensive MRSA colonization surveillance program. It is not intended to diagnose MRSA infection nor to guide or monitor treatment for MRSA infections.       Radiology Studies: Portable Chest X-ray (1 View)  Result Date: 06/10/2017 CLINICAL DATA:  Diabetic ketoacidosis.  Fatigue. EXAM: PORTABLE CHEST 1 VIEW COMPARISON:  None. FINDINGS: The cardiomediastinal contours are normal. The lungs are clear. Pulmonary vasculature is normal. No consolidation, pleural effusion, or pneumothorax. No acute osseous abnormalities are seen. IMPRESSION: No acute abnormality.  Normal portable AP chest radiograph. Electronically Signed   By: Rubye OaksMelanie  Ehinger M.D.   On: 06/10/2017 22:52  Scheduled Meds: . heparin  5,000 Units Subcutaneous Q8H  . [START ON 06/12/2017] pneumococcal 23 valent vaccine  0.5 mL Intramuscular Tomorrow-1000   Continuous Infusions: . sodium chloride    . dextrose 5 % and 0.45% NaCl 150 mL/hr at 06/11/17 1000  . insulin (NOVOLIN-R) infusion 2.5 Units/hr (06/11/17 1249)  . potassium chloride 10 mEq (06/11/17 1234)     LOS: 1 day   Time Spent in minutes   30 minutes  Xochilt Conant D.O. on 06/11/2017 at 1:29 PM  Between 7am to 7pm - Pager - [phone]  After 7pm go to [email or web address] - password [email or web address]  And look for the night coverage person covering for me after hours  [facility] Office  [phone]

## 2017-06-11 NOTE — Progress Notes (Signed)
Dr Aubery LappingMilkhail notifed of Bmet

## 2017-06-11 NOTE — Care Management Note (Signed)
Case Management Note  Patient Details  Name: Adrian Vasquez MRN: [id number] Date of Birth: [birth year]  Subjective/Objective:                  31 y.o. male with medical history no history of diabetes mellitus presented with three-day history of fatigue is assisted with polydipsia and polyuria without any fever or chills. He had had some nausea with an episode of vomiting, no abdominal pain.  She he was seen at the [facility]'s [place] [facility] whereupon he was noted to have hyperglycemia of more than 300 mg/dL with ketonuria and referred [facility].  Nasal chest pain, shortness of breath, diarrhea, palpitations.  ED Course: At the ED patient was hemodynamically stable without any fever. Labs revealed hyperglycemia of 347 mg/dL with elevated anion gap/acidosis, creatinine of 1.25 and potassium 4.1. Urinalysis was negative for any occult infection and patient was then IV fluids with insulin.  Action/Plan: Date:  June 11, 2017 Chart reviewed for concurrent status and case management needs. Will continue to follow patient progress. Discharge Planning: following for needs Expected discharge date: [id number] Marcelle SmilingRhonda Dhalia Zingaro, BSN, VeblenRN3, [place]M   [phone]  Expected Discharge Date:                  Expected Discharge Plan:  Home/Self Care  In-House Referral:     Discharge planning Services  CM Consult  Post Acute Care Choice:    Choice offered to:     DME Arranged:    DME Agency:     HH Arranged:    HH Agency:     Status of Service:  In process, will continue to follow  If discussed at Long Length of Stay Meetings, dates discussed:    Additional Comments:  Golda AcreDavis, Adrian Farrington Lynn, RN 06/11/2017, 9:01 AM

## 2017-06-11 NOTE — Progress Notes (Signed)
Provider, Donnamarie PoagK. Kirby, notified of potassium of 2.9 on AM labs. 60 meq of potassiumm IVPB was ordered and given over 6 hours.

## 2017-06-11 NOTE — Discharge Instructions (Signed)
C-Peptide Test Why am I having this test? The C-peptide test can be used to evaluate your condition if you have diabetes. The test can help your health care provider to determine the cause of low blood sugar (hypoglycemia). This test may also be used to help monitor tumors of the insulin-secreting cells of the pancreas. These tumors are called insulinomas. This test measures the level of C-peptide in your blood. Insulin is released into the bloodstream to help transport glucose into the body's cells to be used in energy production. When insulin is released, equal amounts of C-peptide are also released. This makes C-peptide useful in evaluating insulin production. C-peptide levels may help your health care provider to understand how your pancreas is functioning in the following situations:  If you take insulin and have anti-insulin antibodies.  If you secretly administer insulin to yourself.  If you take insulin to manage your diabetes.  If your health care provider needs to distinguish if you have type 1 or type 2 diabetes.  What kind of sample is taken? A blood sample is required for this test. It is usually collected by inserting a needle into a vein. How do I prepare for this test? Do not eat or drink anything after midnight on the night before the procedure or as directed by your health care provider. What are the reference ranges? Reference ranges are considered healthy ranges established after testing a large group of healthy people. Reference ranges may vary among different people, labs, and hospitals. It is your responsibility to obtain your test results. Ask the lab or department performing the test when and how you will get your results. What do the results mean? Talk with your health care provider to discuss your results, treatment options, and if necessary, the need for more tests. Talk with your health care provider if you have any questions about your results. Talk with your  health care provider to discuss your results, treatment options, and if necessary, the need for more tests. Talk with your health care provider if you have any questions about your results. This information is not intended to replace advice given to you by your health care provider. Make sure you discuss any questions you have with your health care provider. Document Released: 01/02/2005 Document Revised: 08/13/2016 Document Reviewed: 05/05/2014 Elsevier Interactive Patient Education  [employer]2018 Elsevier Inc.

## 2017-06-12 DIAGNOSIS — E876 Hypokalemia: Secondary | ICD-10-CM

## 2017-06-12 DIAGNOSIS — E1010 Type 1 diabetes mellitus with ketoacidosis without coma: Principal | ICD-10-CM

## 2017-06-12 DIAGNOSIS — N179 Acute kidney failure, unspecified: Secondary | ICD-10-CM

## 2017-06-12 LAB — CBC
HCT: 40.3 % (ref 39.0–52.0)
Hemoglobin: 14.3 g/dL (ref 13.0–17.0)
MCH: 30.2 pg (ref 26.0–34.0)
MCHC: 35.5 g/dL (ref 30.0–36.0)
MCV: 85.2 fL (ref 78.0–100.0)
PLATELETS: 227 10*3/uL (ref 150–400)
RBC: 4.73 MIL/uL (ref 4.22–5.81)
RDW: 13 % (ref 11.5–15.5)
WBC: 6.2 10*3/uL (ref 4.0–10.5)

## 2017-06-12 LAB — BASIC METABOLIC PANEL WITH GFR
Anion gap: 12 (ref 5–15)
BUN: 7 mg/dL (ref 6–20)
CO2: 22 mmol/L (ref 22–32)
Calcium: 9.4 mg/dL (ref 8.9–10.3)
Chloride: 103 mmol/L (ref 101–111)
Creatinine, Ser: 0.87 mg/dL (ref 0.61–1.24)
GFR calc Af Amer: >60 mL/min
GFR calc non Af Amer: >60 mL/min
Glucose, Bld: 230 mg/dL — ABNORMAL HIGH (ref 65–99)
Potassium: 3 mmol/L — ABNORMAL LOW (ref 3.5–5.1)
Sodium: 137 mmol/L (ref 135–145)

## 2017-06-12 LAB — GLUCOSE, CAPILLARY
GLUCOSE-CAPILLARY: 252 mg/dL — AB (ref 65–99)
GLUCOSE-CAPILLARY: 231 mg/dL — AB (ref 65–99)
Glucose-Capillary: 223 mg/dL — ABNORMAL HIGH (ref 65–99)

## 2017-06-12 LAB — HEMOGLOBIN A1C
Hgb A1c MFr Bld: >15.5 % — ABNORMAL HIGH (ref 4.8–5.6)
Mean Plasma Glucose: >398 mg/dL

## 2017-06-12 MED ORDER — INSULIN GLARGINE 100 UNIT/ML ~~LOC~~ SOLN: 14.0000 [IU] | Freq: Every day | SUBCUTANEOUS | 11 refills | Status: DC

## 2017-06-12 MED ORDER — METFORMIN HCL 500 MG PO TABS
500.0000 mg | ORAL_TABLET | Freq: Two times a day (BID) | ORAL | Status: DC
  Administered 2017-06-12: 500 mg via ORAL
  Filled 2017-06-12: qty 1

## 2017-06-12 MED ORDER — FREESTYLE SYSTEM KIT: 1.0000 | PACK | 1 refills | Status: DC | PRN

## 2017-06-12 MED ORDER — "INSULIN SYRINGE/NEEDLE 28G X 1/2"" 1 ML MISC": 1.0000 | Freq: Three times a day (TID) | 5 refills | Status: DC

## 2017-06-12 MED ORDER — INSULIN ASPART 100 UNIT/ML ~~LOC~~ SOLN: SUBCUTANEOUS | 12 refills | Status: DC

## 2017-06-12 MED ORDER — POTASSIUM CHLORIDE CRYS ER 20 MEQ PO TBCR
40.0000 meq | EXTENDED_RELEASE_TABLET | Freq: Once | ORAL | Status: AC
  Administered 2017-06-12: 40 meq via ORAL
  Filled 2017-06-12: qty 2

## 2017-06-12 MED ORDER — INSULIN GLARGINE 100 UNIT/ML ~~LOC~~ SOLN
14.0000 [IU] | Freq: Every day | SUBCUTANEOUS | Status: DC
  Administered 2017-06-12: 14 [IU] via SUBCUTANEOUS
  Filled 2017-06-12: qty 0.14

## 2017-06-12 MED ORDER — METFORMIN HCL 500 MG PO TABS: 500.0000 mg | ORAL_TABLET | Freq: Two times a day (BID) | ORAL | 0 refills | Status: AC

## 2017-06-12 MED FILL — FREESTYLE FREEDOM LITE KIT: 30 days supply | Qty: 1 | Fill #0

## 2017-06-12 MED FILL — NovoLOG 100 UNIT/ML SOLN: 100 | 30 days supply | Qty: 10 | Fill #0

## 2017-06-12 MED FILL — FREESTYLE LITE TEST STRIP: 25 days supply | Qty: 100 | Fill #0

## 2017-06-12 MED FILL — metFORMIN HCL 500 MG TABS: 500 | 30 days supply | Qty: 60 | Fill #0

## 2017-06-12 MED FILL — LANTUS 100 UNITS/ML VIAL: 100 | 30 days supply | Qty: 10 | Fill #0

## 2017-06-12 NOTE — Progress Notes (Signed)
Inpatient Diabetes Program Recommendations  AACE/ADA: New Consensus Statement on Inpatient Glycemic Control (2015)  Target Ranges:  Prepandial:   less than 140 mg/dL      Peak postprandial:   less than 180 mg/dL (1-2 hours)      Critically ill patients:  140 - 180 mg/dL   Lab Results  Component Value Date   GLUCAP 231 (H) 06/12/2017   HGBA1C >15.5 (H) 06/10/2017    Review of Glycemic Control  FBS elevated. Lantus increased to 14 units QHS. Post-prandial blood sugars in 200s. Would benefit from meal coverage insulin.  Spoke with pt and mother about HgbA1C of > 15.5%. Discussed goal of 7%. Pt and mother very interested in obtaining PCP to manage his diabetes. Will need assistance with medications. Pt able to draw up and administer his insulin without problems. Will need affordable insulin when discharged.  Inpatient Diabetes Program Recommendations:    May need to transition to affordable insulin prior to discharge.  70/30 12 units bid. If continuing with Lantus, recommend increasing to 15 units QHS. Will need meal coverage insulin - Novolog 4 units tidwc if eating > 50% meal. Titrate as needed. Encourage pt to use hospital meter and check blood sugars to get experience for home. Will need prescription for meter and supplies at d/c.  Continue to follow.  Thank you. Ailene Ardshonda Idelle Reimann, RD, LDN, CDE Inpatient Diabetes Coordinator [phone]

## 2017-06-12 NOTE — Discharge Summary (Signed)
Physician Discharge Summary  Adrian Vasquez GLO:[id number] DOB: 12-18-1986 DOA: 06/10/2017  PCP: Patient, No Pcp Per  Admit date: 06/10/2017 Discharge date: 06/12/2017  Admitted From: home  Disposition:  home   Recommendations for Outpatient Follow-up:  1. Will be [facility]  Discharge Condition:  stable   CODE STATUS:  Full code   Consultations:  none    Discharge Diagnoses:  Principal Problem:   DKA (diabetic ketoacidoses) ([place])  Type 1 DM   Hypokalemia   AKI   Subjective: No complaints today.   Brief Summary: Adrian Vasquez a 31 y.o.malewith medical history no history of diabetes mellitus presented with three-day history of fatigue is assisted with polydipsia and polyuria without any fever or chills. He hadhad some nausea with an episode of vomiting, no abdominal pain.  She he was seen at the doctor's office in [facility] whereupon he was noted to have hyperglycemia of more than 300 mg/dL with ketonuria and referred to the emergency room at [facility]. Nasal chest pain, shortness of breath, diarrhea, palpitations.   Hospital Course:  Diabetic ketoacidosis/diabetes mellitus, type I - new onset -No obvious source of infection, chest x-ray and UA unremarkable for infection -Patient presented with elevated gap of 19, blood sugar 347 -Gap closed and acidosis resolved- transitioned to Lantus and SSI last night - case manger consulted for PCP and assistance with meds  -Hemoglobin A1c 15.5 - have given insulin teaching- have educated him on the basic pathophysiology of Diabetes and a nutritionist has seen him- he states that he is comfortable with injecting himself today and would like to home tomorrow - starting Metformin to decrease insulin resistance   Hypokalemia - cont to replace- Mg level normal  Hyponatremia -Likely secondary to hyperglycemia - sodium levels now normalized -Continue to monitor  BMP  Fatigue - Likely secondary to the above  Acute kidney injury - Likely secondary to dehydration and DKA - Creatinine 1.25 upon admission,- improved to normal    Discharge Instructions  Discharge Instructions    Diet - low sodium heart healthy    Complete by:  As directed    Diet Carb Modified    Complete by:  As directed    Increase activity slowly    Complete by:  As directed      Allergies as of 06/12/2017   No Known Allergies     Medication List    TAKE these medications   bismuth subsalicylate 188 CZ/66AY suspension Commonly known as:  PEPTO BISMOL Take 30 mLs by mouth every 6 (six) hours as needed for indigestion.   calcium carbonate 500 MG chewable tablet Commonly known as:  TUMS - dosed in mg elemental calcium Chew 1 tablet by mouth 2 (two) times daily as needed for indigestion or heartburn.   glucose monitoring kit monitoring kit 1 each by Does not apply route as needed for other. Dispense any model that is covered- dispense testing supplies for Q AC/ HS accuchecks- 1 month supply with one refil.   INS SYRINGE/NEEDLE 1CC/28G 28G X 1/2" 1 ML Misc Commonly known as:  B-D INSULIN SYRINGE 1CC/28G 1 each by Does not apply route 4 (four) times daily -  before meals and at bedtime.   insulin aspart 100 UNIT/ML injection Commonly known as:  NOVOLOG Before each meal 3 times a day, 140-199 - 2 units, 200-250 - 4 units, 251-299 - 6 units,  300-349 - 8 units,  350 or above 10 units. Dispense syringes  and needles as needed, Ok to switch to PEN if approved.   insulin glargine 100 UNIT/ML injection Commonly known as:  LANTUS Inject 0.14 mLs (14 Units total) into the skin at bedtime.   metFORMIN 500 MG tablet Commonly known as:  GLUCOPHAGE Take 1 tablet (500 mg total) by mouth 2 (two) times daily with a meal.      Follow-up Information    [facility]. Call.   Why:  Upon discharge call [phone] for an appointment. Contact  information: [place] [phone]         No Known Allergies   Procedures/Studies:    Portable Chest X-ray (1 View)  Result Date: 06/10/2017 CLINICAL DATA:  Diabetic ketoacidosis.  Fatigue. EXAM: PORTABLE CHEST 1 VIEW COMPARISON:  None. FINDINGS: The cardiomediastinal contours are normal. The lungs are clear. Pulmonary vasculature is normal. No consolidation, pleural effusion, or pneumothorax. No acute osseous abnormalities are seen. IMPRESSION: No acute abnormality.  Normal portable AP chest radiograph. Electronically Signed   By: Jeb Levering M.D.   On: 06/10/2017 22:52        Discharge Exam: Vitals:   06/12/17 1200 06/12/17 1555  BP:    Pulse:    Resp:    Temp: 97.8 F (36.6 C) 97.9 F (36.6 C)   Vitals:   06/12/17 0400 06/12/17 0800 06/12/17 1200 06/12/17 1555  BP: 117/84     Pulse: 84     Resp: 18     Temp:  97.9 F (36.6 C) 97.8 F (36.6 C) 97.9 F (36.6 C)  TempSrc:  Oral Oral Oral  SpO2: 98%     Weight:      Height:        General: Pt is alert, awake, not in acute distress Cardiovascular: RRR, S1/S2 +, no rubs, no gallops Respiratory: CTA bilaterally, no wheezing, no rhonchi Abdominal: Soft, NT, ND, bowel sounds + Extremities: no edema, no cyanosis    The results of significant diagnostics from this hospitalization (including imaging, microbiology, ancillary and laboratory) are listed below for reference.     Microbiology: Recent Results (from the past 240 hour(s))  MRSA PCR Screening     Status: None   Collection Time: 06/10/17  8:02 PM  Result Value Ref Range Status   MRSA by PCR NEGATIVE NEGATIVE Final    Comment:        The GeneXpert MRSA Assay (FDA approved for NASAL specimens only), is one component of a comprehensive MRSA colonization surveillance program. It is not intended to diagnose MRSA infection nor to guide or monitor treatment for MRSA infections.      Labs: BNP  (last 3 results) No results for input(s): BNP in the last 8760 hours. Basic Metabolic Panel:  Recent Labs Lab 06/10/17 2156  06/11/17 0544 06/11/17 1012 06/11/17 1318 06/11/17 1710 06/12/17 0928  NA 133*  < > 134* 136 134* 133* 137  K 3.0*  < > 2.9* 3.1* 3.2* 2.8* 3.0*  CL 107  < > 107 108 105 104 103  CO2 18*  < > 18* 21* 20* 21* 22  GLUCOSE 157*  < > 189* 155* 213* 145* 230*  BUN 9  < > 6 5* <5* <5* 7  CREATININE 0.99  < > 0.87 0.76 0.80 0.74 0.87  CALCIUM 8.7*  < > 8.7* 8.6* 8.8* 9.1 9.4  MG 1.9  --   --   --   --   --   --  PHOS 1.0*  --  3.0  --   --   --   --   < > = values in this interval not displayed. Liver Function Tests: No results for input(s): AST, ALT, ALKPHOS, BILITOT, PROT, ALBUMIN in the last 168 hours. No results for input(s): LIPASE, AMYLASE in the last 168 hours. No results for input(s): AMMONIA in the last 168 hours. CBC:  Recent Labs Lab 06/10/17 1412 06/10/17 2156 06/12/17 0355  WBC 7.1 7.2 6.2  NEUTROABS 3.7  --   --   HGB 17.4* 14.1 14.3  HCT 49.0 40.0 40.3  MCV 86.7 86.0 85.2  PLT 238 219 227   Cardiac Enzymes: No results for input(s): CKTOTAL, CKMB, CKMBINDEX, TROPONINI in the last 168 hours. BNP: Invalid input(s): POCBNP CBG:  Recent Labs Lab 06/11/17 1833 06/11/17 2105 06/12/17 0804 06/12/17 1224 06/12/17 1537  GLUCAP 172* 159* 223* 252* 231*   D-Dimer No results for input(s): DDIMER in the last 72 hours. Hgb A1c  Recent Labs  06/10/17 2156  HGBA1C >15.5*   Lipid Profile No results for input(s): CHOL, HDL, LDLCALC, TRIG, CHOLHDL, LDLDIRECT in the last 72 hours. Thyroid function studies No results for input(s): TSH, T4TOTAL, T3FREE, THYROIDAB in the last 72 hours.  Invalid input(s): FREET3 Anemia work up No results for input(s): VITAMINB12, FOLATE, FERRITIN, TIBC, IRON, RETICCTPCT in the last 72 hours. Urinalysis    Component Value Date/Time   COLORURINE YELLOW 06/10/2017 1602   APPEARANCEUR CLEAR 06/10/2017  1602   LABSPEC 1.025 06/10/2017 1602   PHURINE 5.0 06/10/2017 1602   GLUCOSEU >=500 (A) 06/10/2017 1602   HGBUR TRACE (A) 06/10/2017 1602   BILIRUBINUR NEGATIVE 06/10/2017 1602   KETONESUR >80 (A) 06/10/2017 1602   PROTEINUR 30 (A) 06/10/2017 1602   NITRITE NEGATIVE 06/10/2017 1602   LEUKOCYTESUR NEGATIVE 06/10/2017 1602   Sepsis Labs Invalid input(s): PROCALCITONIN,  WBC,  LACTICIDVEN Microbiology Recent Results (from the past 240 hour(s))  MRSA PCR Screening     Status: None   Collection Time: 06/10/17  8:02 PM  Result Value Ref Range Status   MRSA by PCR NEGATIVE NEGATIVE Final    Comment:        The GeneXpert MRSA Assay (FDA approved for NASAL specimens only), is one component of a comprehensive MRSA colonization surveillance program. It is not intended to diagnose MRSA infection nor to guide or monitor treatment for MRSA infections.      Time coordinating discharge: Over 30 minutes  SIGNED:   Debbe Odea, MD  [facility] 06/12/2017, 5:05 PM Pager   If 7PM-7AM, please contact night-coverage [email or web address] Password [email or web address]

## 2017-06-12 NOTE — Progress Notes (Signed)
Patient discharged without incident to family at front entrance. Patient was supplied with Metformin 500mg  from pharmacy to take with his evening meal. 14u of Lantus given sq prior to discharge.

## 2017-06-12 NOTE — Progress Notes (Signed)
Patient instructed on how to draw up and administer his 5units of novalog insulin . He did this correctly under RN supervision. K 3.0 called to Dr Butler Denmarkizwan.

## 2017-06-12 NOTE — Progress Notes (Signed)
[id number]/TCT-Mother Marie Allen/answered questions concerning medicaid and the [facility]. For his follow hospital visit

## 2017-06-12 NOTE — Progress Notes (Signed)
Date: June 12, 2017 Chart reviewed for discharge orders: [facility] drug program done for patient for insulin and metaformin.  Did discuss the health and wellness clinic and information for appt. Given to patient.  Verbalized his understanding of instructions and system usage. Clide Daleshonda Davis,BSN,RN3, [phone]

## 2017-06-20 ENCOUNTER — Encounter: Payer: Medicaid Other | Attending: Internal Medicine | Admitting: Dietician

## 2017-06-20 ENCOUNTER — Encounter: Payer: Self-pay | Admitting: Dietician

## 2017-06-20 DIAGNOSIS — Z713 Dietary counseling and surveillance: Secondary | ICD-10-CM | POA: Insufficient documentation

## 2017-06-20 DIAGNOSIS — E1010 Type 1 diabetes mellitus with ketoacidosis without coma: Secondary | ICD-10-CM

## 2017-06-20 DIAGNOSIS — E1110 Type 2 diabetes mellitus with ketoacidosis without coma: Secondary | ICD-10-CM | POA: Diagnosis not present

## 2017-06-20 NOTE — Patient Instructions (Signed)
Consider the Relion Meter ([employer] brand) Continue to check your blood sugar 3-4 times per day and anytime you don't feel well, think that your blood sugar is low and before/after exercise.  Continue to be mindful about your meal habits. Be sure to stay hydrated.  Plan:  Aim for 5 Carb Choices per meal (75 grams) +/- 1 either way  Aim for 0-3 Carbs per snack if hungry  Include protein in moderation with your meals and snacks Consider reading food labels for Total Carbohydrate and Fat Grams of foods Consider  increasing your activity level by working, walking, or swimming or other for at least 30 minutes daily as tolerated Continuer taking medication as directed by MD

## 2017-06-20 NOTE — Progress Notes (Signed)
Diabetes Self-Management Education  Visit Type: First/Initial  Appt. Start Time: 1415 Appt. End Time: 1600  06/20/2017  Mr. Adrian Vasquez, identified by name and date of birth, is a 31 y.o. male with a diagnosis of Diabetes: Type 1. Patient stated that he wwas 230 lbs in December and felt that he wanted to lose weight.  He was drinking a gallon of coolade daily and increased amounts of butter.  In February/March, he took a drug test for a friend that showed sugar in the urine.  He started leaving other sugar out of his diet.  This month his mother checked his blood sugar and it was found to be 230 which prompted him to go to the doctor then ER.  His weight was down to 157 lbs at that time and is back up to 171 lbs today.  He would like to regain to 200 lbs.  Medications include Lantus (has not taken since Monday because his blood sugars have been less than 130 per patient) and Novolog sliding scale and has not taken since Saturday due to blood sugar readings that were less than the scale.  He is also on Metformin and Januvia once a day with the largest meal.  Patient lives with his fiance and 3 daughters ages 53,8, and 18.  She does the shopping and cooking.  He is self employed working with recycling/scrap metal.  He does not have Aeronautical engineer.  He does think that he can cover the medical costs to care for himself.  He has been very mindful regarding his diet since diagnosis.  ASSESSMENT  Height 6\' 4"  (1.93 m), weight 171 lb (77.6 kg). Body mass index is 20.81 kg/m.      Diabetes Self-Management Education - 06/20/17 1431      Visit Information   Visit Type First/Initial     Initial Visit   Diabetes Type Type 1   Are you currently following a meal plan? No   Are you taking your medications as prescribed? Yes   Date Diagnosed 06/10/17     Health Coping   How would you rate your overall health? Good     Psychosocial Assessment   Patient Belief/Attitude about Diabetes Motivated  to manage diabetes   Self-care barriers None   Self-management support Doctor's office;Family   Other persons present Patient   Patient Concerns Nutrition/Meal planning;Glycemic Control   Special Needs None   Preferred Learning Style No preference indicated   Learning Readiness Ready   How often do you need to have someone help you when you read instructions, pamphlets, or other written materials from your doctor or pharmacy? 1 - Never   What is the last grade level you completed in school? 1 year college     Pre-Education Assessment   Patient understands the diabetes disease and treatment process. Needs Instruction   Patient understands incorporating nutritional management into lifestyle. Needs Instruction   Patient undertands incorporating physical activity into lifestyle. Needs Instruction   Patient understands using medications safely. Needs Instruction   Patient understands monitoring blood glucose, interpreting and using results Needs Instruction   Patient understands prevention, detection, and treatment of acute complications. Needs Instruction   Patient understands prevention, detection, and treatment of chronic complications. Needs Instruction   Patient understands how to develop strategies to address psychosocial issues. Needs Instruction   Patient understands how to develop strategies to promote health/change behavior. Needs Instruction     Complications   Last HgB A1C per patient/outside source 15.5 %  >  15.5% 06/10/17   How often do you check your blood sugar? 3-4 times/day   Fasting Blood glucose range (mg/dL) [id number]   Postprandial Blood glucose range (mg/dL) >604>200   Number of hypoglycemic episodes per month 0   Number of hyperglycemic episodes per week 1   Can you tell when your blood sugar is high? No   What do you do if your blood sugar is high? take insulin   Have you had a dilated eye exam in the past 12 months? No   Have you had a dental exam in the past 12 months?  No   Are you checking your feet? No     Dietary Intake   Breakfast eggs, [place] bacon, sometimes quick oatmeal with cinnamon and multigrain cheerios with 2% milk  9-9:30   Snack (morning) none   Lunch salad or salad with chicken or steak  12-2, sometimes forget when working until [employer]4   Snack (afternoon) cereal or chicken noodle soup   Dinner fish or chicken, broccoli or other non starchy vegetables and salad OR [place] burger without bun, cheese, salad OR spaghetti with [employer] pasta and [place]key marinara  8   Snack (evening) cereal with 2% milk   Beverage(s) 2% milk, water     Exercise   Exercise Type Strenuous (running)  related to job, some swimming, walking   How many days per week to you exercise? 4   How many minutes per day do you exercise? 60   Total minutes per week of exercise 240     Patient Education   Previous Diabetes Education No   Disease state  Definition of diabetes, type 1 and 2, and the diagnosis of diabetes;Factors that contribute to the development of diabetes   Nutrition management  Role of diet in the treatment of diabetes and the relationship between the three main macronutrients and blood glucose level;Food label reading, portion sizes and measuring food.;Carbohydrate counting;Effects of alcohol on blood glucose and safety factors with consumption of alcohol.;Meal timing in regards to the patients' current diabetes medication.;Meal options for control of blood glucose level and chronic complications.   Physical activity and exercise  Role of exercise on diabetes management, blood pressure control and cardiac health.   Medications Reviewed patients medication for diabetes, action, purpose, timing of dose and side effects.   Monitoring Purpose and frequency of SMBG.;Identified appropriate SMBG and/or A1C goals.;Daily foot exams   Acute complications Taught treatment of hypoglycemia - the 15 rule.;Discussed and identified patients' treatment of hyperglycemia.   Chronic  complications Relationship between chronic complications and blood glucose control;Dental care;Retinopathy and reason for yearly dilated eye exams   Psychosocial adjustment Worked with patient to identify barriers to care and solutions   Personal strategies to promote health Lifestyle issues that need to be addressed for better diabetes care     Individualized Goals (developed by patient)   Nutrition Follow meal plan discussed   Physical Activity Exercise 5-7 days per week;30 minutes per day   Medications take my medication as prescribed   Monitoring  test my blood glucose as discussed   Reducing Risk do foot checks daily;increase portions of healthy fats;increase portions of nuts and seeds;check ketones if blood glucose over 240mg /dL   Health Coping discuss diabetes with (comment)  MD/RD     Post-Education Assessment   Patient understands the diabetes disease and treatment process. Demonstrates understanding / competency   Patient understands incorporating nutritional management into lifestyle. Demonstrates understanding / competency   Patient undertands  incorporating physical activity into lifestyle. Demonstrates understanding / competency   Patient understands using medications safely. Demonstrates understanding / competency   Patient understands monitoring blood glucose, interpreting and using results Demonstrates understanding / competency   Patient understands prevention, detection, and treatment of acute complications. Demonstrates understanding / competency   Patient understands prevention, detection, and treatment of chronic complications. Demonstrates understanding / competency   Patient understands how to develop strategies to address psychosocial issues. Demonstrates understanding / competency   Patient understands how to develop strategies to promote health/change behavior. Demonstrates understanding / competency     Outcomes   Expected Outcomes Demonstrated interest in  learning. Expect positive outcomes   Future DMSE PRN   Program Status Completed      Individualized Plan for Diabetes Self-Management Training:   Learning Objective:  Patient will have a greater understanding of diabetes self-management. Patient education plan is to attend individual and/or group sessions per assessed needs and concerns.   Plan:   Patient Instructions  Consider the Relion Meter ([employer] brand) Continue to check your blood sugar 3-4 times per day and anytime you don't feel well, think that your blood sugar is low and before/after exercise.  Continue to be mindful about your meal habits. Be sure to stay hydrated.  Plan:  Aim for 5 Carb Choices per meal (75 grams) +/- 1 either way  Aim for 0-3 Carbs per snack if hungry  Include protein in moderation with your meals and snacks Consider reading food labels for Total Carbohydrate and Fat Grams of foods Consider  increasing your activity level by working, walking, or swimming or other for at least 30 minutes daily as tolerated Continuer taking medication as directed by MD      Expected Outcomes:  Demonstrated interest in learning. Expect positive outcomes  Education material provided: Living Well with Diabetes, Food label handouts, A1C conversion sheet, Meal plan card, My Plate and Snack sheet  If problems or questions, patient to contact team via:  Phone  Future DSME appointment: PRN

## 2017-07-07 MED FILL — metFORMIN HCL 500 MG TABS: 500 | 30 days supply | Qty: 60 | Fill #0

## 2017-08-13 MED FILL — metFORMIN HCL 500 MG TABS: 500 | 30 days supply | Qty: 60 | Fill #1

## 2017-09-12 MED FILL — metFORMIN HCL 500 MG TABS: 500 | 30 days supply | Qty: 60 | Fill #2

## 2017-10-21 ENCOUNTER — Emergency Department (HOSPITAL_COMMUNITY): Payer: Self-pay

## 2017-10-21 ENCOUNTER — Encounter (HOSPITAL_COMMUNITY): Payer: Self-pay | Admitting: *Deleted

## 2017-10-21 ENCOUNTER — Emergency Department (HOSPITAL_COMMUNITY)
Admission: EM | Admit: 2017-10-21 | Discharge: 2017-10-21 | Disposition: A | Discharge status: Home / Self Care | Payer: Self-pay | Attending: Emergency Medicine | Admitting: Emergency Medicine

## 2017-10-21 DIAGNOSIS — Y939 Activity, unspecified: Secondary | ICD-10-CM | POA: Insufficient documentation

## 2017-10-21 DIAGNOSIS — Z794 Long term (current) use of insulin: Secondary | ICD-10-CM | POA: Insufficient documentation

## 2017-10-21 DIAGNOSIS — S92215B Nondisplaced fracture of cuboid bone of left foot, initial encounter for open fracture: Secondary | ICD-10-CM | POA: Insufficient documentation

## 2017-10-21 DIAGNOSIS — S92345B Nondisplaced fracture of fourth metatarsal bone, left foot, initial encounter for open fracture: Secondary | ICD-10-CM

## 2017-10-21 DIAGNOSIS — E1110 Type 2 diabetes mellitus with ketoacidosis without coma: Secondary | ICD-10-CM | POA: Insufficient documentation

## 2017-10-21 DIAGNOSIS — Y92019 Unspecified place in single-family (private) house as the place of occurrence of the external cause: Secondary | ICD-10-CM | POA: Insufficient documentation

## 2017-10-21 DIAGNOSIS — W3400XA Accidental discharge from unspecified firearms or gun, initial encounter: Secondary | ICD-10-CM | POA: Insufficient documentation

## 2017-10-21 DIAGNOSIS — Y998 Other external cause status: Secondary | ICD-10-CM | POA: Insufficient documentation

## 2017-10-21 LAB — CBC WITH DIFFERENTIAL/PLATELET
BASOS ABS: 0 10*3/uL (ref 0.0–0.1)
BASOS PCT: 0 %
EOS ABS: 0.2 10*3/uL (ref 0.0–0.7)
EOS PCT: 1 %
HCT: 43.7 % (ref 39.0–52.0)
Hemoglobin: 14.7 g/dL (ref 13.0–17.0)
LYMPHS PCT: 20 %
Lymphs Abs: 2.9 10*3/uL (ref 0.7–4.0)
MCH: 31.2 pg (ref 26.0–34.0)
MCHC: 33.6 g/dL (ref 30.0–36.0)
MCV: 92.8 fL (ref 78.0–100.0)
Monocytes Absolute: 0.5 10*3/uL (ref 0.1–1.0)
Monocytes Relative: 4 %
Neutro Abs: 10.7 10*3/uL — ABNORMAL HIGH (ref 1.7–7.7)
Neutrophils Relative %: 75 %
Platelets: 293 10*3/uL (ref 150–400)
RBC: 4.71 MIL/uL (ref 4.22–5.81)
RDW: 12.7 % (ref 11.5–15.5)
WBC: 14.3 10*3/uL — AB (ref 4.0–10.5)

## 2017-10-21 LAB — BASIC METABOLIC PANEL
Anion gap: 8 (ref 5–15)
BUN: 16 mg/dL (ref 6–20)
CHLORIDE: 104 mmol/L (ref 101–111)
CO2: 26 mmol/L (ref 22–32)
CREATININE: 1.35 mg/dL — AB (ref 0.61–1.24)
Calcium: 9 mg/dL (ref 8.9–10.3)
GFR calc non Af Amer: >60 mL/min (ref >60)
Glucose, Bld: 175 mg/dL — ABNORMAL HIGH (ref 65–99)
POTASSIUM: 3.7 mmol/L (ref 3.5–5.1)
SODIUM: 138 mmol/L (ref 135–145)

## 2017-10-21 MED ORDER — CEPHALEXIN 250 MG PO CAPS
250.0000 mg | ORAL_CAPSULE | Freq: Four times a day (QID) | ORAL | 0 refills | Status: AC
Start: 2017-10-21 — End: 2017-10-31

## 2017-10-21 MED ORDER — MORPHINE SULFATE (PF) 4 MG/ML IV SOLN
4.0000 mg | Freq: Once | INTRAVENOUS | Status: AC
Start: 2017-10-21 — End: 2017-10-21
  Administered 2017-10-21: 4 mg via INTRAVENOUS
  Filled 2017-10-21: qty 1

## 2017-10-21 MED ORDER — HYDROCODONE-ACETAMINOPHEN 5-325 MG PO TABS: 1.0000 | ORAL_TABLET | Freq: Four times a day (QID) | ORAL | 0 refills | Status: DC | PRN

## 2017-10-21 MED ORDER — CEFAZOLIN SODIUM-DEXTROSE 2-4 GM/100ML-% IV SOLN
2.0000 g | Freq: Once | INTRAVENOUS | Status: AC
  Administered 2017-10-21: 2 g via INTRAVENOUS
  Filled 2017-10-21: qty 100

## 2017-10-21 MED ORDER — CEPHALEXIN 250 MG PO CAPS: 250.0000 mg | ORAL_CAPSULE | Freq: Four times a day (QID) | ORAL | 0 refills | Status: DC

## 2017-10-21 NOTE — ED Notes (Signed)
[place]. Roseanne RenoStewart.

## 2017-10-21 NOTE — Discharge Instructions (Addendum)
Keep splint intact and dry.  Take tylenol, motrin for pain.   Take vicodin for severe pain.    Keep leg elevated as much as possible.  Do not put weight down on foot. Follow up with Dr. Ophelia CharterYates   Return to ER if you have worse redness, swelling, purulent discharge
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
## 2017-10-21 NOTE — Progress Notes (Signed)
Orthopedic Tech Progress Note Patient Details:  Adrian ShaversWilliam XXXBuchanan 1986-01-14 [id number]  Ortho Devices Type of Ortho Device: Ace wrap, Post (short leg) splint, Crutches Ortho Device/Splint Interventions: Application   [name] 10/21/2017, 3:31 PM
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
## 2017-10-21 NOTE — ED Notes (Signed)
Patient able to ambulate independently  

## 2017-10-21 NOTE — ED Notes (Signed)
Ortho Tech at bedside.  
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
## 2017-10-24 ENCOUNTER — Encounter (INDEPENDENT_AMBULATORY_CARE_PROVIDER_SITE_OTHER): Payer: Self-pay | Admitting: Orthopaedic Surgery

## 2017-10-24 ENCOUNTER — Ambulatory Visit (INDEPENDENT_AMBULATORY_CARE_PROVIDER_SITE_OTHER): Payer: Self-pay | Admitting: Orthopaedic Surgery

## 2017-10-24 VITALS — BP 136/84 | HR 90 | Ht 76.0 in | Wt 185.0 lb

## 2017-10-24 DIAGNOSIS — W3400XD Accidental discharge from unspecified firearms or gun, subsequent encounter: Secondary | ICD-10-CM

## 2017-10-24 DIAGNOSIS — S91332D Puncture wound without foreign body, left foot, subsequent encounter: Principal | ICD-10-CM

## 2017-10-24 DIAGNOSIS — S91302D Unspecified open wound, left foot, subsequent encounter: Secondary | ICD-10-CM
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
## 2017-11-07 ENCOUNTER — Encounter (INDEPENDENT_AMBULATORY_CARE_PROVIDER_SITE_OTHER): Payer: Self-pay | Admitting: Orthopaedic Surgery

## 2017-11-07 ENCOUNTER — Ambulatory Visit (INDEPENDENT_AMBULATORY_CARE_PROVIDER_SITE_OTHER): Payer: Self-pay | Admitting: Orthopaedic Surgery

## 2017-11-07 VITALS — BP 130/77 | HR 90 | Ht 76.0 in | Wt 185.0 lb

## 2017-11-07 DIAGNOSIS — W3400XD Accidental discharge from unspecified firearms or gun, subsequent encounter: Secondary | ICD-10-CM

## 2017-11-07 DIAGNOSIS — W3400XA Accidental discharge from unspecified firearms or gun, initial encounter: Secondary | ICD-10-CM | POA: Insufficient documentation

## 2017-11-07 DIAGNOSIS — S91332D Puncture wound without foreign body, left foot, subsequent encounter: Principal | ICD-10-CM

## 2017-11-07 DIAGNOSIS — S91332A Puncture wound without foreign body, left foot, initial encounter: Secondary | ICD-10-CM

## 2017-11-07 DIAGNOSIS — S91302D Unspecified open wound, left foot, subsequent encounter: Secondary | ICD-10-CM
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
## 2017-11-13 ENCOUNTER — Other Ambulatory Visit: Payer: Self-pay

## 2017-11-13 ENCOUNTER — Emergency Department (HOSPITAL_BASED_OUTPATIENT_CLINIC_OR_DEPARTMENT_OTHER): Payer: No Typology Code available for payment source

## 2017-11-13 ENCOUNTER — Emergency Department (HOSPITAL_BASED_OUTPATIENT_CLINIC_OR_DEPARTMENT_OTHER)
Admission: EM | Admit: 2017-11-13 | Discharge: 2017-11-13 | Disposition: A | Discharge status: Home / Self Care | Payer: No Typology Code available for payment source | Attending: Physician Assistant | Admitting: Physician Assistant

## 2017-11-13 ENCOUNTER — Encounter (HOSPITAL_BASED_OUTPATIENT_CLINIC_OR_DEPARTMENT_OTHER): Payer: Self-pay

## 2017-11-13 DIAGNOSIS — Y929 Unspecified place or not applicable: Secondary | ICD-10-CM | POA: Insufficient documentation

## 2017-11-13 DIAGNOSIS — M25561 Pain in right knee: Secondary | ICD-10-CM | POA: Insufficient documentation

## 2017-11-13 DIAGNOSIS — Z79899 Other long term (current) drug therapy: Secondary | ICD-10-CM | POA: Diagnosis not present

## 2017-11-13 DIAGNOSIS — R0789 Other chest pain: Secondary | ICD-10-CM | POA: Insufficient documentation

## 2017-11-13 DIAGNOSIS — Z7984 Long term (current) use of oral hypoglycemic drugs: Secondary | ICD-10-CM | POA: Diagnosis not present

## 2017-11-13 DIAGNOSIS — E1110 Type 2 diabetes mellitus with ketoacidosis without coma: Secondary | ICD-10-CM | POA: Insufficient documentation

## 2017-11-13 DIAGNOSIS — Y999 Unspecified external cause status: Secondary | ICD-10-CM | POA: Insufficient documentation

## 2017-11-13 DIAGNOSIS — S161XXA Strain of muscle, fascia and tendon at neck level, initial encounter: Secondary | ICD-10-CM | POA: Insufficient documentation

## 2017-11-13 DIAGNOSIS — M25532 Pain in left wrist: Secondary | ICD-10-CM | POA: Insufficient documentation

## 2017-11-13 DIAGNOSIS — Y939 Activity, unspecified: Secondary | ICD-10-CM | POA: Insufficient documentation

## 2017-11-13 MED ORDER — NAPROXEN 375 MG PO TABS: 375.0000 mg | ORAL_TABLET | Freq: Two times a day (BID) | ORAL | 0 refills | Status: AC

## 2017-11-13 MED ORDER — DIAZEPAM 5 MG PO TABS
5.0000 mg | ORAL_TABLET | Freq: Once | ORAL | Status: AC
  Administered 2017-11-13: 5 mg via ORAL
  Filled 2017-11-13: qty 1

## 2017-11-13 MED ORDER — CYCLOBENZAPRINE HCL 10 MG PO TABS: 10.0000 mg | ORAL_TABLET | Freq: Two times a day (BID) | ORAL | 0 refills | Status: AC | PRN

## 2017-11-13 MED ORDER — CYCLOBENZAPRINE HCL 5 MG PO TABS: 5.0000 mg | ORAL_TABLET | Freq: Once | ORAL | Status: DC

## 2017-11-13 MED ORDER — NAPROXEN 250 MG PO TABS
500.0000 mg | ORAL_TABLET | Freq: Once | ORAL | Status: AC
  Administered 2017-11-13: 500 mg via ORAL
  Filled 2017-11-13: qty 2

## 2017-11-13 NOTE — ED Notes (Signed)
Patient transported to CT 
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
## 2017-11-20 ENCOUNTER — Ambulatory Visit (INDEPENDENT_AMBULATORY_CARE_PROVIDER_SITE_OTHER): Payer: Self-pay

## 2017-11-20 ENCOUNTER — Encounter (INDEPENDENT_AMBULATORY_CARE_PROVIDER_SITE_OTHER): Payer: Self-pay | Admitting: Surgery

## 2017-11-20 ENCOUNTER — Ambulatory Visit (INDEPENDENT_AMBULATORY_CARE_PROVIDER_SITE_OTHER): Payer: Self-pay | Admitting: Surgery

## 2017-11-20 DIAGNOSIS — M25532 Pain in left wrist: Secondary | ICD-10-CM

## 2017-11-20 DIAGNOSIS — M25561 Pain in right knee: Secondary | ICD-10-CM

## 2017-11-20 DIAGNOSIS — S92902D Unspecified fracture of left foot, subsequent encounter for fracture with routine healing: Secondary | ICD-10-CM
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
## 2017-12-04 ENCOUNTER — Encounter (INDEPENDENT_AMBULATORY_CARE_PROVIDER_SITE_OTHER): Payer: Self-pay | Admitting: Orthopaedic Surgery

## 2017-12-04 ENCOUNTER — Ambulatory Visit (INDEPENDENT_AMBULATORY_CARE_PROVIDER_SITE_OTHER): Payer: Self-pay | Admitting: Orthopaedic Surgery

## 2017-12-04 VITALS — BP 128/82 | HR 90 | Ht 76.0 in | Wt 185.0 lb

## 2017-12-04 DIAGNOSIS — S91302D Unspecified open wound, left foot, subsequent encounter: Secondary | ICD-10-CM

## 2017-12-04 DIAGNOSIS — S91332D Puncture wound without foreign body, left foot, subsequent encounter: Principal | ICD-10-CM

## 2017-12-04 DIAGNOSIS — W3400XD Accidental discharge from unspecified firearms or gun, subsequent encounter: Secondary | ICD-10-CM
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
## 2018-01-21 ENCOUNTER — Ambulatory Visit (INDEPENDENT_AMBULATORY_CARE_PROVIDER_SITE_OTHER): Payer: Self-pay | Admitting: Surgery

## 2018-01-21 ENCOUNTER — Encounter (INDEPENDENT_AMBULATORY_CARE_PROVIDER_SITE_OTHER): Payer: Self-pay | Admitting: Surgery

## 2018-01-21 DIAGNOSIS — S91332D Puncture wound without foreign body, left foot, subsequent encounter: Principal | ICD-10-CM

## 2018-01-21 DIAGNOSIS — M25561 Pain in right knee: Secondary | ICD-10-CM

## 2018-01-21 DIAGNOSIS — W3400XD Accidental discharge from unspecified firearms or gun, subsequent encounter: Secondary | ICD-10-CM

## 2018-01-21 DIAGNOSIS — M722 Plantar fascial fibromatosis: Secondary | ICD-10-CM

## 2018-01-21 DIAGNOSIS — S91302D Unspecified open wound, left foot, subsequent encounter: Secondary | ICD-10-CM
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
## 2019-07-24 ENCOUNTER — Other Ambulatory Visit: Payer: Self-pay

## 2019-07-24 ENCOUNTER — Encounter (HOSPITAL_BASED_OUTPATIENT_CLINIC_OR_DEPARTMENT_OTHER): Payer: Self-pay | Admitting: *Deleted

## 2019-07-24 ENCOUNTER — Emergency Department (HOSPITAL_BASED_OUTPATIENT_CLINIC_OR_DEPARTMENT_OTHER)
Admission: EM | Admit: 2019-07-24 | Discharge: 2019-07-25 | Discharge status: Against Medical Advice | Payer: Self-pay | Attending: Emergency Medicine | Admitting: Emergency Medicine

## 2019-07-24 DIAGNOSIS — Z5321 Procedure and treatment not carried out due to patient leaving prior to being seen by health care provider: Secondary | ICD-10-CM | POA: Insufficient documentation

## 2019-07-24 NOTE — ED Notes (Signed)
Pt called out asking for someone to come see them. RN stepped into the room. Pt was trying to tell RN something but was not speaking clearly. Pts family stated that the tooth is really bothering him. RN informed patient that the provider was working as quickly as he can. RN offered patient an Ice pack for some comfort while waiting for provider but he declined.

## 2019-07-24 NOTE — ED Triage Notes (Signed)
Right upper dental pain since 2 pm. Taking OTC meds without relief

## 2019-07-25 NOTE — ED Notes (Signed)
Pt left at this time without being seen
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
